# Patient Record
Sex: Male | Born: 1975 | Race: Black or African American | Hispanic: No | Marital: Single | Smoking: Former smoker
Health system: Southern US, Community
[De-identification: ages and names within clinical notes are randomized; demographics above are authoritative.]

## PROBLEM LIST (undated history)

## (undated) DIAGNOSIS — W3400XA Accidental discharge from unspecified firearms or gun, initial encounter: Secondary | ICD-10-CM

## (undated) HISTORY — PX: ABDOMINAL SURGERY: SHX537

---

## 2000-07-04 ENCOUNTER — Emergency Department (HOSPITAL_COMMUNITY): Admission: EM | Admit: 2000-07-04 | Discharge: 2000-07-04 | Payer: Self-pay | Admitting: *Deleted

## 2003-07-20 ENCOUNTER — Emergency Department (HOSPITAL_COMMUNITY): Admission: EM | Admit: 2003-07-20 | Discharge: 2003-07-20 | Payer: Self-pay | Admitting: Emergency Medicine

## 2003-07-21 ENCOUNTER — Emergency Department (HOSPITAL_COMMUNITY): Admission: EM | Admit: 2003-07-21 | Discharge: 2003-07-21 | Payer: Self-pay | Admitting: Emergency Medicine

## 2006-04-09 ENCOUNTER — Emergency Department (HOSPITAL_COMMUNITY): Admission: EM | Admit: 2006-04-09 | Discharge: 2006-04-09 | Payer: Self-pay | Admitting: Emergency Medicine

## 2007-11-30 ENCOUNTER — Inpatient Hospital Stay (HOSPITAL_COMMUNITY): Admission: AC | Admit: 2007-11-30 | Discharge: 2007-12-12 | Payer: Self-pay

## 2007-11-30 ENCOUNTER — Encounter (INDEPENDENT_AMBULATORY_CARE_PROVIDER_SITE_OTHER): Payer: Self-pay | Admitting: General Surgery

## 2007-12-04 HISTORY — PX: COLOSTOMY: SHX63

## 2007-12-06 HISTORY — PX: SMALL INTESTINE SURGERY: SHX150

## 2007-12-10 ENCOUNTER — Encounter (INDEPENDENT_AMBULATORY_CARE_PROVIDER_SITE_OTHER): Payer: Self-pay | Admitting: General Surgery

## 2007-12-10 ENCOUNTER — Ambulatory Visit: Payer: Self-pay | Admitting: Vascular Surgery

## 2007-12-17 ENCOUNTER — Inpatient Hospital Stay (HOSPITAL_COMMUNITY): Admission: EM | Admit: 2007-12-17 | Discharge: 2008-01-10 | Payer: Self-pay | Admitting: Emergency Medicine

## 2008-01-26 ENCOUNTER — Inpatient Hospital Stay (HOSPITAL_COMMUNITY): Admission: EM | Admit: 2008-01-26 | Discharge: 2008-02-04 | Payer: Self-pay | Admitting: Emergency Medicine

## 2008-02-19 ENCOUNTER — Encounter (INDEPENDENT_AMBULATORY_CARE_PROVIDER_SITE_OTHER): Payer: Self-pay | Admitting: Emergency Medicine

## 2008-02-19 ENCOUNTER — Emergency Department (HOSPITAL_COMMUNITY): Admission: EM | Admit: 2008-02-19 | Discharge: 2008-02-19 | Payer: Self-pay | Admitting: Emergency Medicine

## 2008-02-19 ENCOUNTER — Ambulatory Visit: Payer: Self-pay | Admitting: Vascular Surgery

## 2008-03-07 HISTORY — PX: GASTROCUTANEOUS FISTULA CLOSURE: SHX1695

## 2008-03-07 HISTORY — PX: COLOSTOMY REVERSAL: SHX5782

## 2008-05-16 ENCOUNTER — Ambulatory Visit (HOSPITAL_COMMUNITY): Admission: RE | Admit: 2008-05-16 | Discharge: 2008-05-16 | Payer: Self-pay | Admitting: General Surgery

## 2008-06-05 HISTORY — PX: APPENDECTOMY: SHX54

## 2008-06-05 HISTORY — PX: CHOLECYSTECTOMY: SHX55

## 2008-06-16 ENCOUNTER — Inpatient Hospital Stay (HOSPITAL_COMMUNITY): Admission: RE | Admit: 2008-06-16 | Discharge: 2008-06-24 | Payer: Self-pay | Admitting: General Surgery

## 2008-06-16 ENCOUNTER — Encounter (INDEPENDENT_AMBULATORY_CARE_PROVIDER_SITE_OTHER): Payer: Self-pay | Admitting: General Surgery

## 2008-06-17 ENCOUNTER — Ambulatory Visit: Payer: Self-pay | Admitting: Vascular Surgery

## 2008-06-17 ENCOUNTER — Encounter (INDEPENDENT_AMBULATORY_CARE_PROVIDER_SITE_OTHER): Payer: Self-pay | Admitting: General Surgery

## 2008-06-29 ENCOUNTER — Encounter (INDEPENDENT_AMBULATORY_CARE_PROVIDER_SITE_OTHER): Payer: Self-pay | Admitting: General Surgery

## 2008-06-29 ENCOUNTER — Inpatient Hospital Stay (HOSPITAL_COMMUNITY): Admission: EM | Admit: 2008-06-29 | Discharge: 2008-07-14 | Payer: Self-pay | Admitting: Emergency Medicine

## 2008-07-07 ENCOUNTER — Encounter (INDEPENDENT_AMBULATORY_CARE_PROVIDER_SITE_OTHER): Payer: Self-pay | Admitting: General Surgery

## 2008-07-09 ENCOUNTER — Encounter (INDEPENDENT_AMBULATORY_CARE_PROVIDER_SITE_OTHER): Payer: Self-pay | Admitting: Surgery

## 2008-07-09 ENCOUNTER — Ambulatory Visit: Payer: Self-pay | Admitting: Vascular Surgery

## 2008-07-10 ENCOUNTER — Ambulatory Visit: Payer: Self-pay | Admitting: Infectious Diseases

## 2008-10-27 ENCOUNTER — Ambulatory Visit (HOSPITAL_COMMUNITY): Admission: RE | Admit: 2008-10-27 | Discharge: 2008-10-27 | Payer: Self-pay | Admitting: Psychology

## 2008-12-09 ENCOUNTER — Inpatient Hospital Stay (HOSPITAL_COMMUNITY): Admission: RE | Admit: 2008-12-09 | Discharge: 2008-12-15 | Payer: Self-pay | Admitting: General Surgery

## 2008-12-09 ENCOUNTER — Encounter (INDEPENDENT_AMBULATORY_CARE_PROVIDER_SITE_OTHER): Payer: Self-pay | Admitting: General Surgery

## 2009-04-30 ENCOUNTER — Emergency Department (HOSPITAL_COMMUNITY): Admission: EM | Admit: 2009-04-30 | Discharge: 2009-04-30 | Payer: Self-pay | Admitting: Family Medicine
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
## 2010-04-13 IMAGING — CR DG CHEST 1V PORT
1 series · 1 of 1 positions shown · non-contrast
Comparison: 12/18/2007

CLINICAL DATA: Rule out pneumothorax

PORTABLE CHEST - 1 VIEW

[view not recorded]
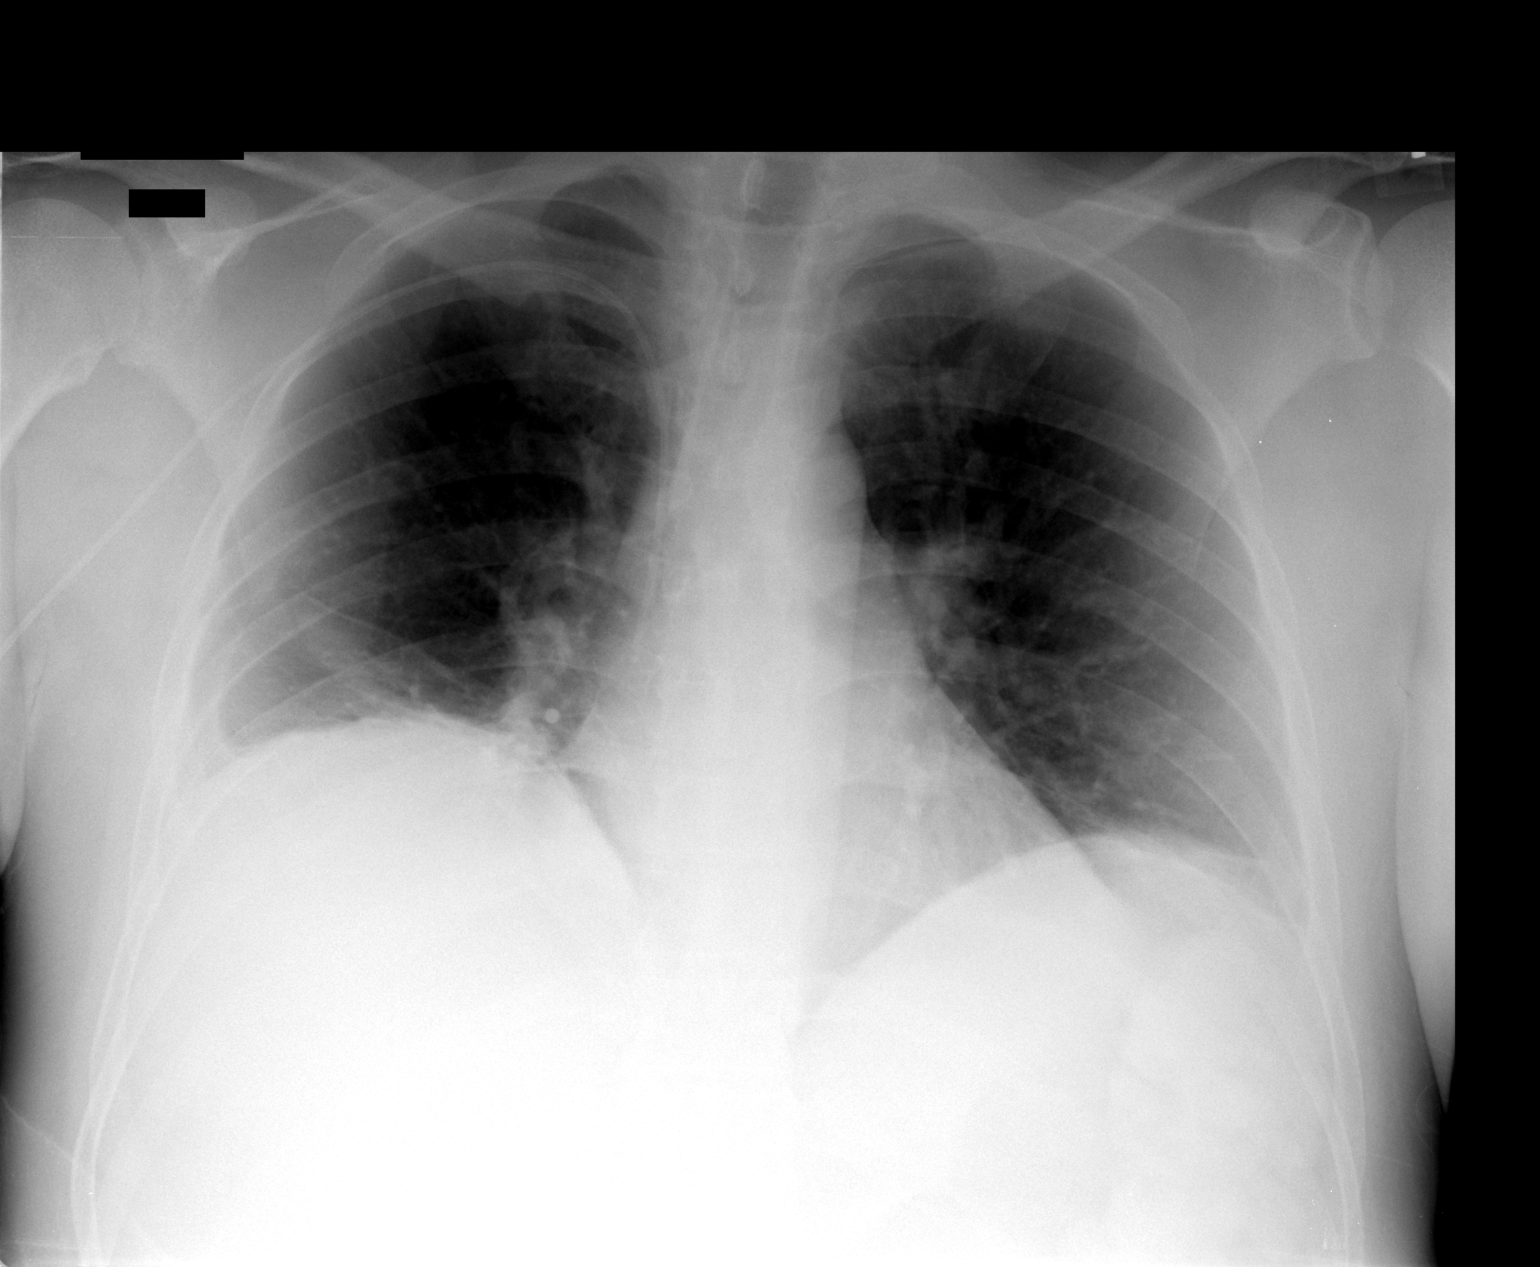

[1 of 1 positions shown; findings below may reference images not displayed]

FINDINGS: Cardiomediastinal silhouette is stable.  Right basilar
atelectasis, scarring or residual infiltrate again noted without
significant change.  No change in the right PICC line position no
diagnostic pneumothorax.
IMPRESSION: No diagnostic pneumothorax.  Stable right basilar atelectasis,
scarring or residual infiltrate.
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
## 2010-06-10 LAB — CROSSMATCH: Antibody Screen: NEGATIVE

## 2010-06-10 LAB — DIFFERENTIAL
Basophils Relative: 0 % (ref 0–1)
Eosinophils Absolute: 0 10*3/uL (ref 0.0–0.7)
Monocytes Absolute: 0.3 10*3/uL (ref 0.1–1.0)
Neutro Abs: 7.5 10*3/uL (ref 1.7–7.7)

## 2010-06-10 LAB — BASIC METABOLIC PANEL
BUN: 2 mg/dL — ABNORMAL LOW (ref 6–23)
CO2: 22 mEq/L (ref 19–32)
Calcium: 8.4 mg/dL (ref 8.4–10.5)
Chloride: 104 mEq/L (ref 96–112)
GFR calc Af Amer: >60 mL/min (ref >60)
GFR calc non Af Amer: >60 mL/min (ref >60)
Glucose, Bld: 170 mg/dL — ABNORMAL HIGH (ref 70–99)
Potassium: 3.6 mEq/L (ref 3.5–5.1)
Sodium: 138 mEq/L (ref 135–145)

## 2010-06-10 LAB — CBC
HCT: 38.1 % — ABNORMAL LOW (ref 39.0–52.0)
Hemoglobin: 10.2 g/dL — ABNORMAL LOW (ref 13.0–17.0)
Hemoglobin: 10.9 g/dL — ABNORMAL LOW (ref 13.0–17.0)
Hemoglobin: 12.8 g/dL — ABNORMAL LOW (ref 13.0–17.0)
MCHC: 33.6 g/dL (ref 30.0–36.0)
MCHC: 33.8 g/dL (ref 30.0–36.0)
MCHC: 34.2 g/dL (ref 30.0–36.0)
MCV: 85.4 fL (ref 78.0–100.0)
Platelets: 148 10*3/uL — ABNORMAL LOW (ref 150–400)
Platelets: 183 10*3/uL (ref 150–400)
RBC: 3.5 MIL/uL — ABNORMAL LOW (ref 4.22–5.81)
RBC: 4.43 MIL/uL (ref 4.22–5.81)
RDW: 15.8 % — ABNORMAL HIGH (ref 11.5–15.5)
RDW: 15.8 % — ABNORMAL HIGH (ref 11.5–15.5)
WBC: 6.6 10*3/uL (ref 4.0–10.5)

## 2010-06-11 LAB — DIFFERENTIAL
Basophils Absolute: 0 10*3/uL (ref 0.0–0.1)
Lymphocytes Relative: 41 % (ref 12–46)
Lymphs Abs: 1.8 10*3/uL (ref 0.7–4.0)
Neutrophils Relative %: 49 % (ref 43–77)

## 2010-06-11 LAB — COMPREHENSIVE METABOLIC PANEL
BUN: 2 mg/dL — ABNORMAL LOW (ref 6–23)
CO2: 32 mEq/L (ref 19–32)
Calcium: 9.8 mg/dL (ref 8.4–10.5)
Chloride: 102 mEq/L (ref 96–112)
Creatinine, Ser: 0.93 mg/dL (ref 0.4–1.5)
GFR calc non Af Amer: >60 mL/min (ref >60)
Glucose, Bld: 95 mg/dL (ref 70–99)
Total Bilirubin: 0.9 mg/dL (ref 0.3–1.2)

## 2010-06-11 LAB — CBC
HCT: 42.1 % (ref 39.0–52.0)
Hemoglobin: 14.2 g/dL (ref 13.0–17.0)
MCHC: 33.7 g/dL (ref 30.0–36.0)
MCV: 84.9 fL (ref 78.0–100.0)
RBC: 4.96 MIL/uL (ref 4.22–5.81)

## 2010-06-11 LAB — PROTIME-INR
INR: 1 (ref 0.00–1.49)
Prothrombin Time: 12.7 seconds (ref 11.6–15.2)

## 2010-06-15 LAB — CATH TIP CULTURE: Culture: >100

## 2010-06-15 LAB — COMPREHENSIVE METABOLIC PANEL
ALT: 100 U/L — ABNORMAL HIGH (ref 0–53)
Albumin: 2.7 g/dL — ABNORMAL LOW (ref 3.5–5.2)
Alkaline Phosphatase: 134 U/L — ABNORMAL HIGH (ref 39–117)
Alkaline Phosphatase: 134 U/L — ABNORMAL HIGH (ref 39–117)
BUN: 11 mg/dL (ref 6–23)
BUN: 7 mg/dL (ref 6–23)
CO2: 26 mEq/L (ref 19–32)
Calcium: 7.9 mg/dL — ABNORMAL LOW (ref 8.4–10.5)
Calcium: 8.3 mg/dL — ABNORMAL LOW (ref 8.4–10.5)
GFR calc non Af Amer: >60 mL/min (ref >60)
Glucose, Bld: 151 mg/dL — ABNORMAL HIGH (ref 70–99)
Potassium: 4.4 mEq/L (ref 3.5–5.1)
Sodium: 138 mEq/L (ref 135–145)
Total Protein: 6.7 g/dL (ref 6.0–8.3)

## 2010-06-15 LAB — GLUCOSE, CAPILLARY
Glucose-Capillary: 105 mg/dL — ABNORMAL HIGH (ref 70–99)
Glucose-Capillary: 106 mg/dL — ABNORMAL HIGH (ref 70–99)
Glucose-Capillary: 107 mg/dL — ABNORMAL HIGH (ref 70–99)
Glucose-Capillary: 108 mg/dL — ABNORMAL HIGH (ref 70–99)
Glucose-Capillary: 112 mg/dL — ABNORMAL HIGH (ref 70–99)
Glucose-Capillary: 117 mg/dL — ABNORMAL HIGH (ref 70–99)
Glucose-Capillary: 119 mg/dL — ABNORMAL HIGH (ref 70–99)
Glucose-Capillary: 123 mg/dL — ABNORMAL HIGH (ref 70–99)
Glucose-Capillary: 147 mg/dL — ABNORMAL HIGH (ref 70–99)
Glucose-Capillary: 156 mg/dL — ABNORMAL HIGH (ref 70–99)
Glucose-Capillary: 81 mg/dL (ref 70–99)
Glucose-Capillary: 87 mg/dL (ref 70–99)
Glucose-Capillary: 93 mg/dL (ref 70–99)
Glucose-Capillary: 97 mg/dL (ref 70–99)

## 2010-06-15 LAB — CBC
HCT: 26.1 % — ABNORMAL LOW (ref 39.0–52.0)
HCT: 26.5 % — ABNORMAL LOW (ref 39.0–52.0)
HCT: 27.3 % — ABNORMAL LOW (ref 39.0–52.0)
HCT: 27.8 % — ABNORMAL LOW (ref 39.0–52.0)
HCT: 30.1 % — ABNORMAL LOW (ref 39.0–52.0)
Hemoglobin: 8.9 g/dL — ABNORMAL LOW (ref 13.0–17.0)
Hemoglobin: 9.1 g/dL — ABNORMAL LOW (ref 13.0–17.0)
Hemoglobin: 9.3 g/dL — ABNORMAL LOW (ref 13.0–17.0)
Hemoglobin: 9.5 g/dL — ABNORMAL LOW (ref 13.0–17.0)
MCHC: 34 g/dL (ref 30.0–36.0)
MCHC: 34.4 g/dL (ref 30.0–36.0)
MCV: 83.8 fL (ref 78.0–100.0)
MCV: 84.2 fL (ref 78.0–100.0)
Platelets: 185 10*3/uL (ref 150–400)
Platelets: 194 10*3/uL (ref 150–400)
Platelets: 263 10*3/uL (ref 150–400)
RBC: 3.09 MIL/uL — ABNORMAL LOW (ref 4.22–5.81)
RBC: 3.19 MIL/uL — ABNORMAL LOW (ref 4.22–5.81)
RDW: 15.2 % (ref 11.5–15.5)
RDW: 15.3 % (ref 11.5–15.5)
RDW: 15.3 % (ref 11.5–15.5)
RDW: 15.4 % (ref 11.5–15.5)
WBC: 4.7 10*3/uL (ref 4.0–10.5)

## 2010-06-15 LAB — URINALYSIS, ROUTINE W REFLEX MICROSCOPIC
Bilirubin Urine: NEGATIVE
Hgb urine dipstick: NEGATIVE
Protein, ur: NEGATIVE mg/dL
Urobilinogen, UA: 0.2 mg/dL (ref 0.0–1.0)

## 2010-06-15 LAB — PHOSPHORUS
Phosphorus: 3 mg/dL (ref 2.3–4.6)
Phosphorus: 3.1 mg/dL (ref 2.3–4.6)

## 2010-06-15 LAB — BASIC METABOLIC PANEL
BUN: 3 mg/dL — ABNORMAL LOW (ref 6–23)
CO2: 27 mEq/L (ref 19–32)
Calcium: 8.2 mg/dL — ABNORMAL LOW (ref 8.4–10.5)
Calcium: 8.2 mg/dL — ABNORMAL LOW (ref 8.4–10.5)
Creatinine, Ser: 0.73 mg/dL (ref 0.4–1.5)
Creatinine, Ser: 0.74 mg/dL (ref 0.4–1.5)
GFR calc Af Amer: >60 mL/min (ref >60)
GFR calc Af Amer: >60 mL/min (ref >60)
GFR calc non Af Amer: >60 mL/min (ref >60)
GFR calc non Af Amer: >60 mL/min (ref >60)
GFR calc non Af Amer: >60 mL/min (ref >60)
Glucose, Bld: 103 mg/dL — ABNORMAL HIGH (ref 70–99)
Glucose, Bld: 151 mg/dL — ABNORMAL HIGH (ref 70–99)
Potassium: 3.6 mEq/L (ref 3.5–5.1)
Sodium: 131 mEq/L — ABNORMAL LOW (ref 135–145)
Sodium: 133 mEq/L — ABNORMAL LOW (ref 135–145)

## 2010-06-15 LAB — DIFFERENTIAL
Basophils Absolute: 0 10*3/uL (ref 0.0–0.1)
Basophils Relative: 0 % (ref 0–1)
Basophils Relative: 0 % (ref 0–1)
Basophils Relative: 1 % (ref 0–1)
Eosinophils Absolute: 0.1 10*3/uL (ref 0.0–0.7)
Eosinophils Absolute: 0.2 10*3/uL (ref 0.0–0.7)
Eosinophils Relative: 2 % (ref 0–5)
Lymphocytes Relative: 17 % (ref 12–46)
Lymphs Abs: 1 10*3/uL (ref 0.7–4.0)
Monocytes Absolute: 0.2 10*3/uL (ref 0.1–1.0)
Monocytes Absolute: 0.6 10*3/uL (ref 0.1–1.0)
Monocytes Relative: 4 % (ref 3–12)
Neutro Abs: 3.5 10*3/uL (ref 1.7–7.7)
Neutro Abs: 4.5 10*3/uL (ref 1.7–7.7)
Neutrophils Relative %: 61 % (ref 43–77)

## 2010-06-15 LAB — URINE CULTURE
Colony Count: NO GROWTH
Culture: NO GROWTH
Special Requests: NEGATIVE

## 2010-06-15 LAB — CLOSTRIDIUM DIFFICILE EIA: C difficile Toxins A+B, EIA: NEGATIVE

## 2010-06-15 LAB — CULTURE, BLOOD (ROUTINE X 2)

## 2010-06-15 LAB — PREALBUMIN: Prealbumin: 13.2 mg/dL — ABNORMAL LOW (ref 18.0–45.0)

## 2010-06-15 LAB — TRIGLYCERIDES: Triglycerides: 73 mg/dL (ref <150)

## 2010-06-15 LAB — MAGNESIUM: Magnesium: 2.3 mg/dL (ref 1.5–2.5)

## 2010-06-15 LAB — HIV ANTIBODY (ROUTINE TESTING W REFLEX): HIV: NONREACTIVE

## 2010-06-15 LAB — CHOLESTEROL, TOTAL: Cholesterol: 97 mg/dL (ref 0–200)

## 2010-06-16 LAB — CBC
HCT: 27.1 % — ABNORMAL LOW (ref 39.0–52.0)
HCT: 27.9 % — ABNORMAL LOW (ref 39.0–52.0)
HCT: 30.6 % — ABNORMAL LOW (ref 39.0–52.0)
HCT: 32.1 % — ABNORMAL LOW (ref 39.0–52.0)
HCT: 33.2 % — ABNORMAL LOW (ref 39.0–52.0)
HCT: 31.5 % — ABNORMAL LOW (ref 39.0–52.0)
HCT: 33.8 % — ABNORMAL LOW (ref 39.0–52.0)
HCT: 34.1 % — ABNORMAL LOW (ref 39.0–52.0)
Hemoglobin: 10.5 g/dL — ABNORMAL LOW (ref 13.0–17.0)
Hemoglobin: 10.9 g/dL — ABNORMAL LOW (ref 13.0–17.0)
Hemoglobin: 11.2 g/dL — ABNORMAL LOW (ref 13.0–17.0)
Hemoglobin: 8.9 g/dL — ABNORMAL LOW (ref 13.0–17.0)
Hemoglobin: 9.2 g/dL — ABNORMAL LOW (ref 13.0–17.0)
Hemoglobin: 9.4 g/dL — ABNORMAL LOW (ref 13.0–17.0)
Hemoglobin: 9.6 g/dL — ABNORMAL LOW (ref 13.0–17.0)
Hemoglobin: 10.7 g/dL — ABNORMAL LOW (ref 13.0–17.0)
Hemoglobin: 11.3 g/dL — ABNORMAL LOW (ref 13.0–17.0)
Hemoglobin: 11.5 g/dL — ABNORMAL LOW (ref 13.0–17.0)
MCHC: 33.6 g/dL (ref 30.0–36.0)
MCHC: 33.9 g/dL (ref 30.0–36.0)
MCHC: 34.3 g/dL (ref 30.0–36.0)
MCHC: 33.3 g/dL (ref 30.0–36.0)
MCHC: 33.7 g/dL (ref 30.0–36.0)
MCHC: 33.9 g/dL (ref 30.0–36.0)
MCHC: 34 g/dL (ref 30.0–36.0)
MCV: 85.9 fL (ref 78.0–100.0)
MCV: 85.9 fL (ref 78.0–100.0)
MCV: 86.6 fL (ref 78.0–100.0)
MCV: 84.9 fL (ref 78.0–100.0)
MCV: 85.3 fL (ref 78.0–100.0)
MCV: 85.3 fL (ref 78.0–100.0)
Platelets: 288 10*3/uL (ref 150–400)
Platelets: 323 K/uL (ref 150–400)
Platelets: 327 10*3/uL (ref 150–400)
Platelets: 332 10*3/uL (ref 150–400)
Platelets: 442 10*3/uL — ABNORMAL HIGH (ref 150–400)
Platelets: 127 10*3/uL — ABNORMAL LOW (ref 150–400)
Platelets: 130 10*3/uL — ABNORMAL LOW (ref 150–400)
Platelets: 140 K/uL — ABNORMAL LOW (ref 150–400)
Platelets: 160 K/uL (ref 150–400)
RBC: 3.15 MIL/uL — ABNORMAL LOW (ref 4.22–5.81)
RBC: 3.22 MIL/uL — ABNORMAL LOW (ref 4.22–5.81)
RBC: 3.27 MIL/uL — ABNORMAL LOW (ref 4.22–5.81)
RBC: 3.7 MIL/uL — ABNORMAL LOW (ref 4.22–5.81)
RBC: 3.96 MIL/uL — ABNORMAL LOW (ref 4.22–5.81)
RBC: 4.02 MIL/uL — ABNORMAL LOW (ref 4.22–5.81)
RDW: 15.3 % (ref 11.5–15.5)
RDW: 15.6 % — ABNORMAL HIGH (ref 11.5–15.5)
RDW: 15.7 % — ABNORMAL HIGH (ref 11.5–15.5)
RDW: 16.6 % — ABNORMAL HIGH (ref 11.5–15.5)
RDW: 16.9 % — ABNORMAL HIGH (ref 11.5–15.5)
RDW: 17 % — ABNORMAL HIGH (ref 11.5–15.5)
RDW: 17.1 % — ABNORMAL HIGH (ref 11.5–15.5)
RDW: 17.6 % — ABNORMAL HIGH (ref 11.5–15.5)
WBC: 11.1 10*3/uL — ABNORMAL HIGH (ref 4.0–10.5)
WBC: 15.2 10*3/uL — ABNORMAL HIGH (ref 4.0–10.5)
WBC: 6.9 10*3/uL (ref 4.0–10.5)
WBC: 6.9 K/uL (ref 4.0–10.5)
WBC: 10.8 10*3/uL — ABNORMAL HIGH (ref 4.0–10.5)
WBC: 11.2 K/uL — ABNORMAL HIGH (ref 4.0–10.5)
WBC: 9.2 K/uL (ref 4.0–10.5)

## 2010-06-16 LAB — MAGNESIUM
Magnesium: 2.2 mg/dL (ref 1.5–2.5)
Magnesium: 2.4 mg/dL (ref 1.5–2.5)
Magnesium: 1.5 mg/dL (ref 1.5–2.5)
Magnesium: 2.2 mg/dL (ref 1.5–2.5)
Magnesium: 2.3 mg/dL (ref 1.5–2.5)

## 2010-06-16 LAB — GLUCOSE, CAPILLARY
Glucose-Capillary: 101 mg/dL — ABNORMAL HIGH (ref 70–99)
Glucose-Capillary: 103 mg/dL — ABNORMAL HIGH (ref 70–99)
Glucose-Capillary: 104 mg/dL — ABNORMAL HIGH (ref 70–99)
Glucose-Capillary: 108 mg/dL — ABNORMAL HIGH (ref 70–99)
Glucose-Capillary: 109 mg/dL — ABNORMAL HIGH (ref 70–99)
Glucose-Capillary: 114 mg/dL — ABNORMAL HIGH (ref 70–99)
Glucose-Capillary: 117 mg/dL — ABNORMAL HIGH (ref 70–99)
Glucose-Capillary: 120 mg/dL — ABNORMAL HIGH (ref 70–99)
Glucose-Capillary: 121 mg/dL — ABNORMAL HIGH (ref 70–99)
Glucose-Capillary: 127 mg/dL — ABNORMAL HIGH (ref 70–99)
Glucose-Capillary: 129 mg/dL — ABNORMAL HIGH (ref 70–99)
Glucose-Capillary: 134 mg/dL — ABNORMAL HIGH (ref 70–99)
Glucose-Capillary: 135 mg/dL — ABNORMAL HIGH (ref 70–99)
Glucose-Capillary: 140 mg/dL — ABNORMAL HIGH (ref 70–99)
Glucose-Capillary: 143 mg/dL — ABNORMAL HIGH (ref 70–99)
Glucose-Capillary: 144 mg/dL — ABNORMAL HIGH (ref 70–99)
Glucose-Capillary: 145 mg/dL — ABNORMAL HIGH (ref 70–99)
Glucose-Capillary: 146 mg/dL — ABNORMAL HIGH (ref 70–99)
Glucose-Capillary: 147 mg/dL — ABNORMAL HIGH (ref 70–99)
Glucose-Capillary: 153 mg/dL — ABNORMAL HIGH (ref 70–99)
Glucose-Capillary: 165 mg/dL — ABNORMAL HIGH (ref 70–99)
Glucose-Capillary: 219 mg/dL — ABNORMAL HIGH (ref 70–99)
Glucose-Capillary: 82 mg/dL (ref 70–99)
Glucose-Capillary: 83 mg/dL (ref 70–99)
Glucose-Capillary: 85 mg/dL (ref 70–99)
Glucose-Capillary: 95 mg/dL (ref 70–99)
Glucose-Capillary: 97 mg/dL (ref 70–99)
Glucose-Capillary: 111 mg/dL — ABNORMAL HIGH (ref 70–99)
Glucose-Capillary: 116 mg/dL — ABNORMAL HIGH (ref 70–99)
Glucose-Capillary: 119 mg/dL — ABNORMAL HIGH (ref 70–99)
Glucose-Capillary: 143 mg/dL — ABNORMAL HIGH (ref 70–99)
Glucose-Capillary: 170 mg/dL — ABNORMAL HIGH (ref 70–99)
Glucose-Capillary: 173 mg/dL — ABNORMAL HIGH (ref 70–99)
Glucose-Capillary: 182 mg/dL — ABNORMAL HIGH (ref 70–99)
Glucose-Capillary: 216 mg/dL — ABNORMAL HIGH (ref 70–99)
Glucose-Capillary: 255 mg/dL — ABNORMAL HIGH (ref 70–99)
Glucose-Capillary: 89 mg/dL (ref 70–99)

## 2010-06-16 LAB — COMPREHENSIVE METABOLIC PANEL
ALT: 118 U/L — ABNORMAL HIGH (ref 0–53)
ALT: 110 U/L — ABNORMAL HIGH (ref 0–53)
ALT: 130 U/L — ABNORMAL HIGH (ref 0–53)
ALT: 222 U/L — ABNORMAL HIGH (ref 0–53)
AST: 24 U/L (ref 0–37)
AST: 49 U/L — ABNORMAL HIGH (ref 0–37)
AST: 91 U/L — ABNORMAL HIGH (ref 0–37)
Albumin: 2 g/dL — ABNORMAL LOW (ref 3.5–5.2)
Albumin: 2.4 g/dL — ABNORMAL LOW (ref 3.5–5.2)
Albumin: 2.4 g/dL — ABNORMAL LOW (ref 3.5–5.2)
Alkaline Phosphatase: 133 U/L — ABNORMAL HIGH (ref 39–117)
Alkaline Phosphatase: 168 U/L — ABNORMAL HIGH (ref 39–117)
Alkaline Phosphatase: 106 U/L (ref 39–117)
Alkaline Phosphatase: 116 U/L (ref 39–117)
BUN: 11 mg/dL (ref 6–23)
BUN: 11 mg/dL (ref 6–23)
CO2: 26 mEq/L (ref 19–32)
CO2: 27 mEq/L (ref 19–32)
CO2: 29 mEq/L (ref 19–32)
CO2: 29 mEq/L (ref 19–32)
Calcium: 7.6 mg/dL — ABNORMAL LOW (ref 8.4–10.5)
Calcium: 8 mg/dL — ABNORMAL LOW (ref 8.4–10.5)
Calcium: 8.2 mg/dL — ABNORMAL LOW (ref 8.4–10.5)
Calcium: 8.3 mg/dL — ABNORMAL LOW (ref 8.4–10.5)
Chloride: 102 mEq/L (ref 96–112)
Chloride: 102 mEq/L (ref 96–112)
Chloride: 98 mEq/L (ref 96–112)
Creatinine, Ser: 0.58 mg/dL (ref 0.4–1.5)
Creatinine, Ser: 0.66 mg/dL (ref 0.4–1.5)
Creatinine, Ser: 0.67 mg/dL (ref 0.4–1.5)
GFR calc Af Amer: >60 mL/min (ref >60)
GFR calc Af Amer: >60 mL/min (ref >60)
GFR calc Af Amer: >60 mL/min (ref >60)
GFR calc non Af Amer: >60 mL/min (ref >60)
GFR calc non Af Amer: >60 mL/min (ref >60)
GFR calc non Af Amer: >60 mL/min (ref >60)
GFR calc non Af Amer: >60 mL/min (ref >60)
Glucose, Bld: 131 mg/dL — ABNORMAL HIGH (ref 70–99)
Glucose, Bld: 136 mg/dL — ABNORMAL HIGH (ref 70–99)
Glucose, Bld: 208 mg/dL — ABNORMAL HIGH (ref 70–99)
Glucose, Bld: 179 mg/dL — ABNORMAL HIGH (ref 70–99)
Glucose, Bld: 224 mg/dL — ABNORMAL HIGH (ref 70–99)
Potassium: 4.4 mEq/L (ref 3.5–5.1)
Potassium: 4 mEq/L (ref 3.5–5.1)
Potassium: 4.3 mEq/L (ref 3.5–5.1)
Sodium: 136 mEq/L (ref 135–145)
Sodium: 134 mEq/L — ABNORMAL LOW (ref 135–145)
Sodium: 135 mEq/L (ref 135–145)
Sodium: 135 mEq/L (ref 135–145)
Total Bilirubin: 0.6 mg/dL (ref 0.3–1.2)
Total Bilirubin: 0.6 mg/dL (ref 0.3–1.2)
Total Bilirubin: 2.4 mg/dL — ABNORMAL HIGH (ref 0.3–1.2)
Total Bilirubin: 3 mg/dL — ABNORMAL HIGH (ref 0.3–1.2)
Total Protein: 4.9 g/dL — ABNORMAL LOW (ref 6.0–8.3)
Total Protein: 5.2 g/dL — ABNORMAL LOW (ref 6.0–8.3)
Total Protein: 5.6 g/dL — ABNORMAL LOW (ref 6.0–8.3)

## 2010-06-16 LAB — BASIC METABOLIC PANEL
BUN: 12 mg/dL (ref 6–23)
BUN: 5 mg/dL — ABNORMAL LOW (ref 6–23)
CO2: 26 mEq/L (ref 19–32)
CO2: 29 mEq/L (ref 19–32)
Calcium: 8.6 mg/dL (ref 8.4–10.5)
Chloride: 105 mEq/L (ref 96–112)
GFR calc Af Amer: >60 mL/min (ref >60)
GFR calc Af Amer: >60 mL/min (ref >60)
GFR calc non Af Amer: >60 mL/min (ref >60)
GFR calc non Af Amer: >60 mL/min (ref >60)
GFR calc non Af Amer: >60 mL/min (ref >60)
GFR calc non Af Amer: >60 mL/min (ref >60)
Glucose, Bld: 138 mg/dL — ABNORMAL HIGH (ref 70–99)
Glucose, Bld: 160 mg/dL — ABNORMAL HIGH (ref 70–99)
Glucose, Bld: 178 mg/dL — ABNORMAL HIGH (ref 70–99)
Glucose, Bld: 209 mg/dL — ABNORMAL HIGH (ref 70–99)
Potassium: 4 mEq/L (ref 3.5–5.1)
Potassium: 4 mEq/L (ref 3.5–5.1)
Potassium: 4.3 mEq/L (ref 3.5–5.1)
Sodium: 135 mEq/L (ref 135–145)
Sodium: 136 mEq/L (ref 135–145)
Sodium: 138 mEq/L (ref 135–145)

## 2010-06-16 LAB — DIFFERENTIAL
Basophils Absolute: 0 K/uL (ref 0.0–0.1)
Basophils Relative: 0 % (ref 0–1)
Basophils Relative: 0 % (ref 0–1)
Basophils Relative: 1 % (ref 0–1)
Eosinophils Absolute: 0.3 K/uL (ref 0.0–0.7)
Eosinophils Absolute: 0.4 10*3/uL (ref 0.0–0.7)
Eosinophils Absolute: 0 10*3/uL (ref 0.0–0.7)
Eosinophils Relative: 0 % (ref 0–5)
Eosinophils Relative: 5 % (ref 0–5)
Eosinophils Relative: 0 % (ref 0–5)
Lymphocytes Relative: 21 % (ref 12–46)
Lymphocytes Relative: 4 % — ABNORMAL LOW (ref 12–46)
Lymphocytes Relative: 8 % — ABNORMAL LOW (ref 12–46)
Lymphs Abs: 0.6 10*3/uL — ABNORMAL LOW (ref 0.7–4.0)
Lymphs Abs: 1.5 K/uL (ref 0.7–4.0)
Lymphs Abs: 1 10*3/uL (ref 0.7–4.0)
Monocytes Absolute: 0.4 K/uL (ref 0.1–1.0)
Monocytes Absolute: 0.9 10*3/uL (ref 0.1–1.0)
Monocytes Absolute: 0.7 10*3/uL (ref 0.1–1.0)
Monocytes Relative: 5 % (ref 3–12)
Monocytes Relative: 7 % (ref 3–12)
Neutro Abs: 10.7 10*3/uL — ABNORMAL HIGH (ref 1.7–7.7)
Neutro Abs: 4.8 K/uL (ref 1.7–7.7)
Neutrophils Relative %: 69 % (ref 43–77)
Neutrophils Relative %: 74 % (ref 43–77)
Neutrophils Relative %: 85 % — ABNORMAL HIGH (ref 43–77)

## 2010-06-16 LAB — CROSSMATCH: Antibody Screen: NEGATIVE

## 2010-06-16 LAB — POCT I-STAT, CHEM 8
BUN: 7 mg/dL (ref 6–23)
Chloride: 100 mEq/L (ref 96–112)
Creatinine, Ser: 0.6 mg/dL (ref 0.4–1.5)
Glucose, Bld: 186 mg/dL — ABNORMAL HIGH (ref 70–99)
Potassium: 3.6 mEq/L (ref 3.5–5.1)

## 2010-06-16 LAB — PHOSPHORUS
Phosphorus: 2.3 mg/dL (ref 2.3–4.6)
Phosphorus: 2.6 mg/dL (ref 2.3–4.6)
Phosphorus: 1.7 mg/dL — ABNORMAL LOW (ref 2.3–4.6)
Phosphorus: 5 mg/dL — ABNORMAL HIGH (ref 2.3–4.6)
Phosphorus: <1 mg/dL — CL (ref 2.3–4.6)

## 2010-06-16 LAB — HEPATIC FUNCTION PANEL
ALT: 217 U/L — ABNORMAL HIGH (ref 0–53)
Alkaline Phosphatase: 148 U/L — ABNORMAL HIGH (ref 39–117)
Indirect Bilirubin: 0.6 mg/dL (ref 0.3–0.9)
Total Protein: 7.8 g/dL (ref 6.0–8.3)

## 2010-06-16 LAB — LIPID PANEL
HDL: 20 mg/dL — ABNORMAL LOW
Total CHOL/HDL Ratio: 4.7 ratio
Triglycerides: 52 mg/dL
VLDL: 10 mg/dL (ref 0–40)

## 2010-06-16 LAB — POCT I-STAT EG7
Bicarbonate: 24.6 mEq/L — ABNORMAL HIGH (ref 20.0–24.0)
Calcium, Ion: 1.14 mmol/L (ref 1.12–1.32)
HCT: 24 % — ABNORMAL LOW (ref 39.0–52.0)
Hemoglobin: 8.2 g/dL — ABNORMAL LOW (ref 13.0–17.0)
Sodium: 133 mEq/L — ABNORMAL LOW (ref 135–145)
pH, Ven: 7.486 — ABNORMAL HIGH (ref 7.250–7.300)
pO2, Ven: 384 mmHg — ABNORMAL HIGH (ref 30.0–45.0)

## 2010-06-16 LAB — CULTURE, ROUTINE-ABSCESS

## 2010-06-16 LAB — PREALBUMIN
Prealbumin: 12.2 mg/dL — ABNORMAL LOW (ref 18.0–45.0)
Prealbumin: 8.2 mg/dL — ABNORMAL LOW (ref 18.0–45.0)
Prealbumin: 15.9 mg/dL — ABNORMAL LOW (ref 18.0–45.0)
Prealbumin: 8.4 mg/dL — ABNORMAL LOW (ref 18.0–45.0)

## 2010-06-16 LAB — CHOLESTEROL, TOTAL
Cholesterol: 67 mg/dL (ref 0–200)
Cholesterol: 122 mg/dL (ref 0–200)

## 2010-06-16 LAB — ANAEROBIC CULTURE

## 2010-06-16 LAB — ABO/RH: ABO/RH(D): A POS

## 2010-06-16 LAB — TYPE AND SCREEN: Antibody Screen: NEGATIVE

## 2010-06-16 LAB — TRIGLYCERIDES
Triglycerides: 107 mg/dL (ref <150)
Triglycerides: 42 mg/dL (ref <150)
Triglycerides: 80 mg/dL (ref <150)
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
## 2010-07-20 NOTE — Op Note (Signed)
[name], [name]                   ACCOUNT NO.:  [id number]   MEDICAL RECORD NO.:  [id number]          PATIENT TYPE:  INP   LOCATION:  5159                         FACILITY:  [facility]   PHYSICIAN:  Cherylynn Ridges, M.D.    DATE OF BIRTH:  1975-05-20   DATE OF PROCEDURE:  12/21/2007  DATE OF DISCHARGE:                               OPERATIVE REPORT   PREOPERATIVE DIAGNOSIS:  Open abdominal wound with enterocutaneous  fistula.   POSTOPERATIVE DIAGNOSIS:  Open abdominal wound with enterocutaneous  fistula.   PROCEDURE:  1. Incision and drainage of abdominal wound.  2. Placement of enterostomy tube.  3. Vacuum-assisted closure device dressing.   SURGEON:  Cherylynn Ridges, MD   ASSISTANT:  Lazaro Arms, PA   ANESTHESIA:  General with a laryngeal airway.   ESTIMATED BLOOD LOSS:  Less than 50 mL.   COMPLICATIONS:  No complication.   CONDITION:  Stable.   FINDINGS:  The patient had a small bowel fistula in the lower portion of  the abdominal wound with bilious drainage.   INDICATIONS FOR OPERATION:  The patient is a 35 year old with drainage  from a lower abdominal wound, who had a necrotic open wound, now comes  in for re-exploration and drainage.   OPERATION:  The patient was taken to the operating room and placed on  the table in supine position.  After an adequate general laryngeal  airway anesthetic was administered, his previous dressing, which was  removed and which had bile staining around his left lower quadrant nylon  catheter and also his right lower quadrant wound and from the midline  lower portion of the wound.   We sprayed and then painted with Betadine.  The initial portion of  procedure was used to identify the fistula.  There was bilious drainage  from the mid to lower portion of the abdomen, which upon further  dissection demonstrated a loop of small bowel with a small  intracutaneous fistula.  We could clearly demonstrate this to be the  actual area of  drainage and was supposed to try and to repair this with  little success with all the inflammatory exudate around it.  We chose to  place a 16-French red Robinson catheter with additional holes placed in  the sidewalls deep into the bowel through the fistulous site secured in  place with interrupted pursestring suture of 3-0 Vicryl pop-offs and  then bringing out the right to lower quadrant portion of the abdomen  away from the midline of skin edge.  We irrigated with saline solution  copiously into the pelvic where there was also necrotic debris.  We  removed the nylon catheter, which was in place and replaced with a 19-mm  Blake drain.  This was secured with a 2-0 nylon.  The antrostomy tube  was also secured with a 2-0 nylon.  The right lower quadrant wound was  irrigated with saline solution copiously and palpated deep into the  pelvic area.  We closed this with a long piece of white foam from the  [facility] dressing and then a  subcutaneous piece white foam and then a more  superficial piece of black foam with a VAC dressing.  The midline wound  was covered with a lower portion of the abdomen with a mid patellar  directly on the bowel and then with a white foam followed by a black  foam.  We covered the dressing with the appropriate drape of closure and  applied suction, which did allow the wound to flow and come down and to  be adequately controlled this.  We irrigated the antrostomy tube where  we did get back enteric contents.  We could not flush out any enteric  contents by flushing the antrostomy tube.  There did not appear to be a  second fistula.  Once the dressing was completely checked down, we put a  binder on the patient and taking to his room to recovery room in stable  condition.      Cherylynn Ridges, M.D.  Electronically Signed     JOW/MEDQ  D:  12/21/2007  T:  12/22/2007  Job:  [id number]
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
## 2010-12-06 LAB — CBC
HCT: 43.1
Hemoglobin: 14.2
Hemoglobin: 14.3
Hemoglobin: 14.4
MCHC: 33.2
MCHC: 33.6
MCHC: 33.8
MCV: 87.6
MCV: 88.8
MCV: 89.7
Platelets: 162
Platelets: 224
RBC: 4.64
RBC: 4.77
RBC: 4.85
RBC: 4.9
RDW: 13.1
WBC: 7.7
WBC: 9.4
WBC: 9.6

## 2010-12-06 LAB — BASIC METABOLIC PANEL
BUN: 3 — ABNORMAL LOW
CO2: 28
CO2: 29
Calcium: 8.6
Chloride: 102
Chloride: 104
Creatinine, Ser: 0.95
Creatinine, Ser: 1.16
GFR calc Af Amer: >60
GFR calc Af Amer: >60
Glucose, Bld: 117 — ABNORMAL HIGH
Glucose, Bld: 135 — ABNORMAL HIGH
Sodium: 137
Sodium: 137

## 2010-12-06 LAB — POCT I-STAT, CHEM 8
BUN: 8
Chloride: 103
Creatinine, Ser: 1.4
Potassium: 2.5 — CL
Sodium: 139
TCO2: 19

## 2010-12-06 LAB — TYPE AND SCREEN

## 2010-12-06 LAB — ABO/RH: ABO/RH(D): A POS

## 2010-12-07 LAB — GLUCOSE, CAPILLARY
Glucose-Capillary: 103 — ABNORMAL HIGH
Glucose-Capillary: 107 — ABNORMAL HIGH
Glucose-Capillary: 108 — ABNORMAL HIGH
Glucose-Capillary: 109 — ABNORMAL HIGH
Glucose-Capillary: 109 — ABNORMAL HIGH
Glucose-Capillary: 115 — ABNORMAL HIGH
Glucose-Capillary: 116 — ABNORMAL HIGH
Glucose-Capillary: 116 — ABNORMAL HIGH
Glucose-Capillary: 117 — ABNORMAL HIGH
Glucose-Capillary: 118 — ABNORMAL HIGH
Glucose-Capillary: 119 — ABNORMAL HIGH
Glucose-Capillary: 119 — ABNORMAL HIGH
Glucose-Capillary: 119 — ABNORMAL HIGH
Glucose-Capillary: 121 — ABNORMAL HIGH
Glucose-Capillary: 121 — ABNORMAL HIGH
Glucose-Capillary: 121 — ABNORMAL HIGH
Glucose-Capillary: 124 — ABNORMAL HIGH
Glucose-Capillary: 125 — ABNORMAL HIGH
Glucose-Capillary: 131 — ABNORMAL HIGH
Glucose-Capillary: 131 — ABNORMAL HIGH
Glucose-Capillary: 132 — ABNORMAL HIGH
Glucose-Capillary: 133 — ABNORMAL HIGH
Glucose-Capillary: 137 — ABNORMAL HIGH
Glucose-Capillary: 139 — ABNORMAL HIGH
Glucose-Capillary: 143 — ABNORMAL HIGH
Glucose-Capillary: 144 — ABNORMAL HIGH
Glucose-Capillary: 144 — ABNORMAL HIGH
Glucose-Capillary: 145 — ABNORMAL HIGH
Glucose-Capillary: 155 — ABNORMAL HIGH
Glucose-Capillary: 175 — ABNORMAL HIGH
Glucose-Capillary: 178 — ABNORMAL HIGH
Glucose-Capillary: 181 — ABNORMAL HIGH
Glucose-Capillary: 181 — ABNORMAL HIGH
Glucose-Capillary: 186 — ABNORMAL HIGH
Glucose-Capillary: 188 — ABNORMAL HIGH
Glucose-Capillary: 192 — ABNORMAL HIGH
Glucose-Capillary: 218 — ABNORMAL HIGH
Glucose-Capillary: 103 mg/dL — ABNORMAL HIGH (ref 70–99)
Glucose-Capillary: 103 mg/dL — ABNORMAL HIGH (ref 70–99)
Glucose-Capillary: 111 mg/dL — ABNORMAL HIGH (ref 70–99)
Glucose-Capillary: 119 mg/dL — ABNORMAL HIGH (ref 70–99)
Glucose-Capillary: 119 mg/dL — ABNORMAL HIGH (ref 70–99)
Glucose-Capillary: 121 mg/dL — ABNORMAL HIGH (ref 70–99)
Glucose-Capillary: 123 mg/dL — ABNORMAL HIGH (ref 70–99)
Glucose-Capillary: 124 mg/dL — ABNORMAL HIGH (ref 70–99)
Glucose-Capillary: 128 mg/dL — ABNORMAL HIGH (ref 70–99)
Glucose-Capillary: 128 mg/dL — ABNORMAL HIGH (ref 70–99)
Glucose-Capillary: 131 mg/dL — ABNORMAL HIGH (ref 70–99)
Glucose-Capillary: 135 mg/dL — ABNORMAL HIGH (ref 70–99)
Glucose-Capillary: 139 mg/dL — ABNORMAL HIGH (ref 70–99)
Glucose-Capillary: 141 mg/dL — ABNORMAL HIGH (ref 70–99)
Glucose-Capillary: 148 mg/dL — ABNORMAL HIGH (ref 70–99)

## 2010-12-07 LAB — DIFFERENTIAL
Basophils Relative: 0
Basophils Relative: 0
Basophils Relative: 1
Basophils Relative: 1 % (ref 0–1)
Basophils Relative: 1 % (ref 0–1)
Basophils Relative: 1 % (ref 0–1)
Eosinophils Absolute: 0.2
Eosinophils Absolute: 0.4
Eosinophils Absolute: 0.4 10*3/uL (ref 0.0–0.7)
Eosinophils Relative: 5
Eosinophils Relative: 7 — ABNORMAL HIGH
Eosinophils Relative: 4 % (ref 0–5)
Eosinophils Relative: 6 % — ABNORMAL HIGH (ref 0–5)
Lymphocytes Relative: 13
Lymphocytes Relative: 13
Lymphs Abs: 1.1
Lymphs Abs: 1.2
Lymphs Abs: 2.7 10*3/uL (ref 0.7–4.0)
Monocytes Absolute: 0.3
Monocytes Absolute: 0.5
Monocytes Absolute: 0.2 10*3/uL (ref 0.1–1.0)
Monocytes Absolute: 0.4 10*3/uL (ref 0.1–1.0)
Monocytes Relative: 5
Monocytes Relative: 5
Monocytes Relative: 9
Monocytes Relative: 4 % (ref 3–12)
Monocytes Relative: 5 % (ref 3–12)
Monocytes Relative: 7 % (ref 3–12)
Neutro Abs: 7.5
Neutro Abs: 1.5 10*3/uL — ABNORMAL LOW (ref 1.7–7.7)
Neutro Abs: 3 10*3/uL (ref 1.7–7.7)
Neutrophils Relative %: 78 — ABNORMAL HIGH
Neutrophils Relative %: 84 — ABNORMAL HIGH
Neutrophils Relative %: 61 % (ref 43–77)

## 2010-12-07 LAB — BASIC METABOLIC PANEL
BUN: 5 — ABNORMAL LOW
BUN: 6
BUN: 12
BUN: 13
BUN: 11 mg/dL (ref 6–23)
CO2: 30
CO2: 31
CO2: 31
CO2: 25
CO2: 26
CO2: 26
CO2: 29
CO2: 27 mEq/L (ref 19–32)
CO2: 28 mEq/L (ref 19–32)
CO2: 28 mEq/L (ref 19–32)
Calcium: 7.9 — ABNORMAL LOW
Calcium: 8.1 — ABNORMAL LOW
Calcium: 8.2 — ABNORMAL LOW
Calcium: 8.2 — ABNORMAL LOW
Calcium: 8.4
Calcium: 8.7 mg/dL (ref 8.4–10.5)
Calcium: 9.1 mg/dL (ref 8.4–10.5)
Calcium: 9.3 mg/dL (ref 8.4–10.5)
Calcium: 9.4 mg/dL (ref 8.4–10.5)
Chloride: 92 — ABNORMAL LOW
Chloride: 102
Chloride: 102
Chloride: 101 mEq/L (ref 96–112)
Creatinine, Ser: 1.15
Creatinine, Ser: 0.69 mg/dL (ref 0.4–1.5)
Creatinine, Ser: 0.77 mg/dL (ref 0.4–1.5)
GFR calc Af Amer: >60
GFR calc Af Amer: >60
GFR calc Af Amer: >60
GFR calc Af Amer: >60
GFR calc Af Amer: >60
GFR calc Af Amer: >60
GFR calc Af Amer: >60 mL/min (ref >60)
GFR calc Af Amer: >60 mL/min (ref >60)
GFR calc Af Amer: >60 mL/min (ref >60)
GFR calc Af Amer: >60 mL/min (ref >60)
GFR calc Af Amer: >60 mL/min (ref >60)
GFR calc Af Amer: >60 mL/min (ref >60)
GFR calc non Af Amer: >60
GFR calc non Af Amer: >60
GFR calc non Af Amer: >60
GFR calc non Af Amer: >60
GFR calc non Af Amer: >60 mL/min (ref >60)
GFR calc non Af Amer: >60 mL/min (ref >60)
GFR calc non Af Amer: >60 mL/min (ref >60)
GFR calc non Af Amer: >60 mL/min (ref >60)
Glucose, Bld: 105 — ABNORMAL HIGH
Glucose, Bld: 111 — ABNORMAL HIGH
Glucose, Bld: 131 — ABNORMAL HIGH
Glucose, Bld: 136 — ABNORMAL HIGH
Glucose, Bld: 110 — ABNORMAL HIGH
Glucose, Bld: 159 — ABNORMAL HIGH
Glucose, Bld: 171 — ABNORMAL HIGH
Glucose, Bld: 107 mg/dL — ABNORMAL HIGH (ref 70–99)
Glucose, Bld: 128 mg/dL — ABNORMAL HIGH (ref 70–99)
Glucose, Bld: 154 mg/dL — ABNORMAL HIGH (ref 70–99)
Potassium: 3.1 — ABNORMAL LOW
Potassium: 3.1 — ABNORMAL LOW
Potassium: 3.5
Potassium: 3.9
Potassium: 3.9
Potassium: 3.9
Potassium: 4
Potassium: 4.1
Potassium: 3.1 mEq/L — ABNORMAL LOW (ref 3.5–5.1)
Potassium: 3.9 mEq/L (ref 3.5–5.1)
Potassium: 4 mEq/L (ref 3.5–5.1)
Potassium: 4.4 mEq/L (ref 3.5–5.1)
Sodium: 129 — ABNORMAL LOW
Sodium: 131 — ABNORMAL LOW
Sodium: 132 — ABNORMAL LOW
Sodium: 134 — ABNORMAL LOW
Sodium: 130 — ABNORMAL LOW
Sodium: 130 — ABNORMAL LOW
Sodium: 133 — ABNORMAL LOW
Sodium: 135
Sodium: 135
Sodium: 132 mEq/L — ABNORMAL LOW (ref 135–145)
Sodium: 137 mEq/L (ref 135–145)
Sodium: 137 mEq/L (ref 135–145)
Sodium: 138 mEq/L (ref 135–145)

## 2010-12-07 LAB — COMPREHENSIVE METABOLIC PANEL
ALT: 42
ALT: 59 — ABNORMAL HIGH
ALT: 67 — ABNORMAL HIGH
ALT: 135 U/L — ABNORMAL HIGH (ref 0–53)
ALT: 33 U/L (ref 0–53)
ALT: 74 U/L — ABNORMAL HIGH (ref 0–53)
AST: 31
AST: 31
AST: 40 — ABNORMAL HIGH
AST: 50 — ABNORMAL HIGH
AST: 54 — ABNORMAL HIGH
AST: 55 — ABNORMAL HIGH
AST: 25 U/L (ref 0–37)
AST: 53 U/L — ABNORMAL HIGH (ref 0–37)
Albumin: 1.9 — ABNORMAL LOW
Albumin: 2.1 — ABNORMAL LOW
Albumin: 2.2 — ABNORMAL LOW
Albumin: 2.3 — ABNORMAL LOW
Albumin: 2.7 — ABNORMAL LOW
Albumin: 2.9 — ABNORMAL LOW
Albumin: 3.1 g/dL — ABNORMAL LOW (ref 3.5–5.2)
Albumin: 3.3 g/dL — ABNORMAL LOW (ref 3.5–5.2)
Alkaline Phosphatase: 105
Alkaline Phosphatase: 125 — ABNORMAL HIGH
Alkaline Phosphatase: 91
Alkaline Phosphatase: 95
Alkaline Phosphatase: 75 U/L (ref 39–117)
Alkaline Phosphatase: 86 U/L (ref 39–117)
Alkaline Phosphatase: 99 U/L (ref 39–117)
BUN: 13
BUN: 14
BUN: 6
BUN: 9
BUN: 2 mg/dL — ABNORMAL LOW (ref 6–23)
CO2: 24
CO2: 25
CO2: 26
CO2: 27
CO2: 29
CO2: 29 mEq/L (ref 19–32)
Calcium: 7.9 — ABNORMAL LOW
Calcium: 8.1 — ABNORMAL LOW
Calcium: 8.2 — ABNORMAL LOW
Calcium: 8.2 — ABNORMAL LOW
Calcium: 8.4
Calcium: 9.2
Calcium: 9.5
Calcium: 9.4 mg/dL (ref 8.4–10.5)
Calcium: 9.5 mg/dL (ref 8.4–10.5)
Chloride: 102
Chloride: 103
Chloride: 103
Chloride: 103
Chloride: 95 — ABNORMAL LOW
Chloride: 99
Chloride: 102 mEq/L (ref 96–112)
Chloride: 105 mEq/L (ref 96–112)
Creatinine, Ser: 0.74
Creatinine, Ser: 0.77
Creatinine, Ser: 0.78
Creatinine, Ser: 0.87
Creatinine, Ser: 0.89
Creatinine, Ser: 0.68 mg/dL (ref 0.4–1.5)
GFR calc Af Amer: >60
GFR calc Af Amer: >60
GFR calc Af Amer: >60
GFR calc Af Amer: >60
GFR calc Af Amer: >60
GFR calc Af Amer: >60 mL/min (ref >60)
GFR calc Af Amer: >60 mL/min (ref >60)
GFR calc Af Amer: >60 mL/min (ref >60)
GFR calc Af Amer: >60 mL/min (ref >60)
GFR calc non Af Amer: >60
GFR calc non Af Amer: >60
GFR calc non Af Amer: >60
GFR calc non Af Amer: >60
GFR calc non Af Amer: >60 mL/min (ref >60)
Glucose, Bld: 109 — ABNORMAL HIGH
Glucose, Bld: 114 — ABNORMAL HIGH
Glucose, Bld: 134 — ABNORMAL HIGH
Glucose, Bld: 105 mg/dL — ABNORMAL HIGH (ref 70–99)
Glucose, Bld: 124 mg/dL — ABNORMAL HIGH (ref 70–99)
Potassium: 3.6
Potassium: 3.7
Potassium: 3.8
Potassium: 4.4
Potassium: 3.3 mEq/L — ABNORMAL LOW (ref 3.5–5.1)
Potassium: 4.1 mEq/L (ref 3.5–5.1)
Potassium: 4.8 mEq/L (ref 3.5–5.1)
Sodium: 132 — ABNORMAL LOW
Sodium: 134 — ABNORMAL LOW
Sodium: 135
Sodium: 136 mEq/L (ref 135–145)
Sodium: 139 mEq/L (ref 135–145)
Sodium: 140 mEq/L (ref 135–145)
Sodium: 141 mEq/L (ref 135–145)
Total Bilirubin: 0.4
Total Bilirubin: 0.5
Total Bilirubin: 0.5
Total Bilirubin: 0.6
Total Bilirubin: 0.6
Total Bilirubin: 1
Total Bilirubin: 0.2 mg/dL — ABNORMAL LOW (ref 0.3–1.2)
Total Protein: 6.8
Total Protein: 6.9
Total Protein: 7
Total Protein: 7.7
Total Protein: 6.2 g/dL (ref 6.0–8.3)
Total Protein: 6.4 g/dL (ref 6.0–8.3)
Total Protein: 6.7 g/dL (ref 6.0–8.3)

## 2010-12-07 LAB — CULTURE, BLOOD (ROUTINE X 2)
Culture: NO GROWTH
Culture: NO GROWTH

## 2010-12-07 LAB — CBC
HCT: 30 — ABNORMAL LOW
HCT: 30.5 — ABNORMAL LOW
HCT: 31.7 — ABNORMAL LOW
HCT: 26.7 — ABNORMAL LOW
HCT: 27.7 — ABNORMAL LOW
HCT: 27.8 — ABNORMAL LOW
HCT: 28.1 — ABNORMAL LOW
HCT: 28.8 — ABNORMAL LOW
HCT: 29.2 — ABNORMAL LOW
HCT: 29.3 — ABNORMAL LOW
HCT: 35.1 % — ABNORMAL LOW (ref 39.0–52.0)
HCT: 34.2 — ABNORMAL LOW
Hemoglobin: 10.2 — ABNORMAL LOW
Hemoglobin: 10.4 — ABNORMAL LOW
Hemoglobin: 10.7 — ABNORMAL LOW
Hemoglobin: 11.1 — ABNORMAL LOW
Hemoglobin: 11.9 — ABNORMAL LOW
Hemoglobin: 10.5 — ABNORMAL LOW
Hemoglobin: 8.9 — ABNORMAL LOW
Hemoglobin: 9.1 — ABNORMAL LOW
Hemoglobin: 9.2 — ABNORMAL LOW
Hemoglobin: 9.4 — ABNORMAL LOW
Hemoglobin: 9.8 — ABNORMAL LOW
Hemoglobin: 12.4 g/dL — ABNORMAL LOW (ref 13.0–17.0)
Hemoglobin: 12.9 g/dL — ABNORMAL LOW (ref 13.0–17.0)
Hemoglobin: 11.6 — ABNORMAL LOW
MCHC: 33.7
MCHC: 34.1
MCHC: 34.5
MCHC: 33
MCHC: 33.2
MCHC: 33.3
MCHC: 33.5
MCHC: 33.7
MCHC: 33.8
MCHC: 34.1
MCHC: 32.5 g/dL (ref 30.0–36.0)
MCHC: 33.2 g/dL (ref 30.0–36.0)
MCHC: 33.8
MCV: 88.7
MCV: 85.9
MCV: 86.6
MCV: 86.6
MCV: 88.4
MCV: 88.6
MCV: 89
Platelets: 293
Platelets: 381
Platelets: 392
Platelets: 325
Platelets: 439 — ABNORMAL HIGH
Platelets: 447 — ABNORMAL HIGH
Platelets: 462 — ABNORMAL HIGH
Platelets: 470 — ABNORMAL HIGH
Platelets: 583 — ABNORMAL HIGH
Platelets: 589 — ABNORMAL HIGH
Platelets: 233 10*3/uL (ref 150–400)
Platelets: 260 10*3/uL (ref 150–400)
Platelets: 250
RBC: 3.44 — ABNORMAL LOW
RBC: 3.59 — ABNORMAL LOW
RBC: 4 — ABNORMAL LOW
RBC: 3.04 — ABNORMAL LOW
RBC: 3.29 — ABNORMAL LOW
RBC: 3.31 — ABNORMAL LOW
RBC: 3.32 — ABNORMAL LOW
RBC: 3.62 — ABNORMAL LOW
RBC: 4.06 MIL/uL — ABNORMAL LOW (ref 4.22–5.81)
RBC: 4.35 MIL/uL (ref 4.22–5.81)
RBC: 4.53 MIL/uL (ref 4.22–5.81)
RDW: 13.6
RDW: 13.6
RDW: 13.9
RDW: 14.3
RDW: 13.6
RDW: 13.8
RDW: 14
RDW: 14.2
RDW: 14.2
RDW: 15
RDW: 13.5 % (ref 11.5–15.5)
RDW: 13.8 % (ref 11.5–15.5)
RDW: 13.9 % (ref 11.5–15.5)
RDW: 15.5
WBC: 12.7 — ABNORMAL HIGH
WBC: 15.2 — ABNORMAL HIGH
WBC: 10.3
WBC: 10.9 — ABNORMAL HIGH
WBC: 12.8 — ABNORMAL HIGH
WBC: 15.9 — ABNORMAL HIGH
WBC: 16 — ABNORMAL HIGH
WBC: 7.4
WBC: 4.1 10*3/uL (ref 4.0–10.5)
WBC: 4.6 10*3/uL (ref 4.0–10.5)
WBC: 5.3 10*3/uL (ref 4.0–10.5)
WBC: 9 10*3/uL (ref 4.0–10.5)

## 2010-12-07 LAB — TRIGLYCERIDES
Triglycerides: 100
Triglycerides: 164 — ABNORMAL HIGH
Triglycerides: 97

## 2010-12-07 LAB — PHOSPHORUS
Phosphorus: 3.1
Phosphorus: 3.1
Phosphorus: 3.5
Phosphorus: 4.6
Phosphorus: 5.2 — ABNORMAL HIGH
Phosphorus: 1.7 mg/dL — ABNORMAL LOW (ref 2.3–4.6)
Phosphorus: 2.9 mg/dL (ref 2.3–4.6)
Phosphorus: 3.9 mg/dL (ref 2.3–4.6)
Phosphorus: 4.4 mg/dL (ref 2.3–4.6)
Phosphorus: 4.6 mg/dL (ref 2.3–4.6)
Phosphorus: 4.9 mg/dL — ABNORMAL HIGH (ref 2.3–4.6)

## 2010-12-07 LAB — ANAEROBIC CULTURE

## 2010-12-07 LAB — PROTIME-INR
INR: 1
Prothrombin Time: 13.6

## 2010-12-07 LAB — CULTURE, ROUTINE-ABSCESS

## 2010-12-07 LAB — CHOLESTEROL, TOTAL
Cholesterol: 116
Cholesterol: 99
Cholesterol: 134 mg/dL (ref 0–200)

## 2010-12-07 LAB — MAGNESIUM
Magnesium: 2.1
Magnesium: 2.2
Magnesium: 2.5
Magnesium: 1.7 mg/dL (ref 1.5–2.5)
Magnesium: 1.9 mg/dL (ref 1.5–2.5)

## 2010-12-07 LAB — URINE CULTURE: Colony Count: NO GROWTH

## 2010-12-07 LAB — WOUND CULTURE

## 2010-12-07 LAB — VANCOMYCIN, TROUGH: Vancomycin Tr: 16.1

## 2010-12-07 LAB — GRAM STAIN

## 2010-12-07 LAB — PREALBUMIN: Prealbumin: 21.9

## 2010-12-09 LAB — BASIC METABOLIC PANEL
CO2: 27 mEq/L (ref 19–32)
Calcium: 9 mg/dL (ref 8.4–10.5)
Creatinine, Ser: 0.73 mg/dL (ref 0.4–1.5)
GFR calc Af Amer: >60 mL/min (ref >60)
Glucose, Bld: 252 mg/dL — ABNORMAL HIGH (ref 70–99)

## 2010-12-09 LAB — CBC
MCHC: 33.2 g/dL (ref 30.0–36.0)
RDW: 14.3 % (ref 11.5–15.5)

## 2010-12-09 LAB — APTT: aPTT: 31 seconds (ref 24–37)

## 2010-12-09 LAB — DIFFERENTIAL
Basophils Absolute: 0.1 10*3/uL (ref 0.0–0.1)
Basophils Relative: 1 % (ref 0–1)
Neutro Abs: 6.4 10*3/uL (ref 1.7–7.7)
Neutrophils Relative %: 79 % — ABNORMAL HIGH (ref 43–77)

## 2010-12-09 LAB — PROTIME-INR
INR: 1 (ref 0.00–1.49)
Prothrombin Time: 13.1 seconds (ref 11.6–15.2)

## 2011-06-10 ENCOUNTER — Encounter (HOSPITAL_COMMUNITY): Payer: Self-pay | Admitting: Emergency Medicine

## 2011-06-10 ENCOUNTER — Emergency Department (HOSPITAL_COMMUNITY)
Admission: EM | Admit: 2011-06-10 | Discharge: 2011-06-10 | Disposition: A | Discharge status: Home / Self Care | Payer: Self-pay | Attending: Emergency Medicine | Admitting: Emergency Medicine

## 2011-06-10 DIAGNOSIS — R112 Nausea with vomiting, unspecified: Secondary | ICD-10-CM | POA: Insufficient documentation

## 2011-06-10 DIAGNOSIS — K5289 Other specified noninfective gastroenteritis and colitis: Secondary | ICD-10-CM | POA: Insufficient documentation

## 2011-06-10 DIAGNOSIS — R197 Diarrhea, unspecified: Secondary | ICD-10-CM | POA: Insufficient documentation

## 2011-06-10 DIAGNOSIS — K529 Noninfective gastroenteritis and colitis, unspecified: Secondary | ICD-10-CM

## 2011-06-10 HISTORY — DX: Accidental discharge from unspecified firearms or gun, initial encounter: W34.00XA

## 2011-06-10 LAB — URINALYSIS, ROUTINE W REFLEX MICROSCOPIC
Ketones, ur: 40 mg/dL — AB
Nitrite: NEGATIVE
Urobilinogen, UA: 0.2 mg/dL (ref 0.0–1.0)

## 2011-06-10 LAB — POCT I-STAT, CHEM 8
Calcium, Ion: 1.12 mmol/L (ref 1.12–1.32)
Chloride: 109 mEq/L (ref 96–112)
Creatinine, Ser: 1 mg/dL (ref 0.50–1.35)
Glucose, Bld: 111 mg/dL — ABNORMAL HIGH (ref 70–99)
HCT: 47 % (ref 39.0–52.0)
Potassium: 3.3 mEq/L — ABNORMAL LOW (ref 3.5–5.1)

## 2011-06-10 LAB — URINE MICROSCOPIC-ADD ON

## 2011-06-10 MED ORDER — POTASSIUM CHLORIDE CRYS ER 20 MEQ PO TBCR
40.0000 meq | EXTENDED_RELEASE_TABLET | Freq: Once | ORAL | Status: AC
  Administered 2011-06-10: 40 meq via ORAL
  Filled 2011-06-10: qty 2

## 2011-06-10 MED ORDER — ONDANSETRON HCL 4 MG/2ML IJ SOLN
4.0000 mg | Freq: Once | INTRAMUSCULAR | Status: AC
  Administered 2011-06-10: 4 mg via INTRAVENOUS
  Filled 2011-06-10: qty 2

## 2011-06-10 MED ORDER — ONDANSETRON HCL 4 MG PO TABS: 4.0000 mg | ORAL_TABLET | Freq: Four times a day (QID) | ORAL | Status: AC

## 2011-06-10 MED ORDER — SODIUM CHLORIDE 0.9 % IV BOLUS (SEPSIS)
1000.0000 mL | Freq: Once | INTRAVENOUS | Status: AC
  Administered 2011-06-10: 1000 mL via INTRAVENOUS

## 2011-06-10 NOTE — ED Notes (Signed)
PT reports feeling better and passed PO challenge.

## 2011-06-10 NOTE — ED Notes (Signed)
Patient is resting comfortably. 
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
## 2011-06-10 NOTE — Discharge Instructions (Signed)
Return to the ED with any concern including vomiting and not able to keep down liquids, abdominal pain- especially if it localizes to the right lower abdomen, blood in vomit or stools, decreased level of alertness/lethargy, or any other alarming symptoms

## 2011-06-10 NOTE — ED Notes (Signed)
PT ambulated with a steady gait; VSS; A&Ox3; no signs of distress; respirations even and unlabored; skin warm and dry. No questions at this time; pt reports he is feeling better.

## 2012-06-13 ENCOUNTER — Telehealth (HOSPITAL_COMMUNITY): Payer: Self-pay | Admitting: Emergency Medicine

## 2012-06-15 ENCOUNTER — Ambulatory Visit (INDEPENDENT_AMBULATORY_CARE_PROVIDER_SITE_OTHER): Payer: 59 | Admitting: Orthopedic Surgery

## 2012-06-15 ENCOUNTER — Encounter (INDEPENDENT_AMBULATORY_CARE_PROVIDER_SITE_OTHER): Payer: Self-pay

## 2012-06-15 VITALS — BP 120/82 | HR 72 | Temp 98.5°F | Resp 12 | Ht 72.0 in | Wt 184.4 lb

## 2012-06-15 DIAGNOSIS — L989 Disorder of the skin and subcutaneous tissue, unspecified: Secondary | ICD-10-CM

## 2012-06-18 DIAGNOSIS — L989 Disorder of the skin and subcutaneous tissue, unspecified: Secondary | ICD-10-CM | POA: Insufficient documentation
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
## 2012-06-21 ENCOUNTER — Telehealth (HOSPITAL_COMMUNITY): Payer: Self-pay | Admitting: Emergency Medicine

## 2012-06-21 NOTE — Telephone Encounter (Signed)
[name] he could come 4/25 anytime between 10 and 11.

## 2012-06-29 ENCOUNTER — Other Ambulatory Visit: Payer: Self-pay

## 2012-06-29 ENCOUNTER — Other Ambulatory Visit (INDEPENDENT_AMBULATORY_CARE_PROVIDER_SITE_OTHER): Payer: Self-pay | Admitting: General Surgery

## 2012-06-29 ENCOUNTER — Encounter (INDEPENDENT_AMBULATORY_CARE_PROVIDER_SITE_OTHER): Payer: Self-pay

## 2012-06-29 ENCOUNTER — Ambulatory Visit (INDEPENDENT_AMBULATORY_CARE_PROVIDER_SITE_OTHER): Payer: 59 | Admitting: Orthopedic Surgery

## 2012-06-29 VITALS — BP 134/78 | HR 74 | Temp 98.4°F | Resp 18 | Ht 72.0 in | Wt 180.0 lb

## 2012-06-29 DIAGNOSIS — L723 Sebaceous cyst: Secondary | ICD-10-CM

## 2012-06-29 DIAGNOSIS — T8189XS Other complications of procedures, not elsewhere classified, sequela: Secondary | ICD-10-CM

## 2012-06-29 DIAGNOSIS — L989 Disorder of the skin and subcutaneous tissue, unspecified: Secondary | ICD-10-CM

## 2012-06-29 MED ORDER — HYDROCODONE-ACETAMINOPHEN 5-325 MG PO TABS: 1.0000 | ORAL_TABLET | ORAL | Status: AC | PRN

## 2012-06-29 NOTE — Patient Instructions (Signed)
Wash wound daily in shower with soap and water. °Do not soak. °Apply antibiotic ointment (e.g. Neosporin) twice daily and as needed to keep moist. °Cover with dry dressing. ° °

## 2012-06-29 NOTE — Addendum Note (Signed)
Addended by: Charma Igo on: 06/29/2012 03:17 PM   Modules accepted: Orders

## 2012-06-29 NOTE — Progress Notes (Signed)
Procedure Note  Procedure: Excisional biopsy abdomen  Indication: Abdominal wall skin lesion  Surgeon: Charma Igo, PA-C  Assist: None  Anesthesia: <72ml 1% lidocaine w/epi  EBL: None  Complications: None  Findings: Risks and benefits of procedure, as well as not doing procedure, were explained to patient at last visit. He still wished to proceed with removal. Written consent was obtained. Approximately 1 - 1.58ml 1% lidocaine w/epinephrine was infiltrated into the subcutaneous tissues around and under the lesion. The area was prepped and draped sterilely and adequate anesthesia was confirmed. An elliptical incision was made around the lesion with a #15 blade. The skin edge was grasped with Adson's forceps and the tissue and lesions were excised at skin depth. The offending suture was identified in the wound bed. I was able to grasp this with the forceps and it was pulled out to it's maximum length, ~1cm, and trimmed with iris scissors. The remaining wound was dressed with antibiotic ointment and a non-adherent dressing. The lesions will be sent to pathology. The patient tolerated the procedure well.   Evan Caldron, PA-C Pager: [phone] General Trauma PA Pager: [phone]

## 2012-07-06 ENCOUNTER — Encounter (INDEPENDENT_AMBULATORY_CARE_PROVIDER_SITE_OTHER): Payer: 59

## 2015-01-19 ENCOUNTER — Other Ambulatory Visit: Payer: Self-pay | Admitting: Internal Medicine

## 2015-01-19 ENCOUNTER — Ambulatory Visit
Admission: RE | Admit: 2015-01-19 | Discharge: 2015-01-19 | Disposition: A | Discharge status: Home / Self Care | Payer: 59 | Source: Ambulatory Visit | Attending: Internal Medicine | Admitting: Internal Medicine

## 2015-01-19 DIAGNOSIS — R52 Pain, unspecified: Secondary | ICD-10-CM

## 2016-06-24 ENCOUNTER — Emergency Department (HOSPITAL_COMMUNITY): Payer: 59

## 2016-06-24 ENCOUNTER — Emergency Department (HOSPITAL_COMMUNITY)
Admission: EM | Admit: 2016-06-24 | Discharge: 2016-06-24 | Disposition: A | Discharge status: Home / Self Care | Payer: 59 | Attending: Emergency Medicine | Admitting: Emergency Medicine

## 2016-06-24 ENCOUNTER — Encounter (HOSPITAL_COMMUNITY): Payer: Self-pay

## 2016-06-24 DIAGNOSIS — F12920 Cannabis use, unspecified with intoxication, uncomplicated: Secondary | ICD-10-CM

## 2016-06-24 DIAGNOSIS — Z87891 Personal history of nicotine dependence: Secondary | ICD-10-CM | POA: Insufficient documentation

## 2016-06-24 DIAGNOSIS — Z79899 Other long term (current) drug therapy: Secondary | ICD-10-CM | POA: Diagnosis not present

## 2016-06-24 DIAGNOSIS — R109 Unspecified abdominal pain: Secondary | ICD-10-CM | POA: Diagnosis present

## 2016-06-24 DIAGNOSIS — K529 Noninfective gastroenteritis and colitis, unspecified: Secondary | ICD-10-CM | POA: Insufficient documentation

## 2016-06-24 DIAGNOSIS — F12120 Cannabis abuse with intoxication, uncomplicated: Secondary | ICD-10-CM | POA: Diagnosis not present

## 2016-06-24 LAB — I-STAT CHEM 8, ED
BUN: 12 mg/dL (ref 6–20)
CREATININE: 1.1 mg/dL (ref 0.61–1.24)
Calcium, Ion: 1.07 mmol/L — ABNORMAL LOW (ref 1.15–1.40)
Chloride: 103 mmol/L (ref 101–111)
GLUCOSE: 141 mg/dL — AB (ref 65–99)
HCT: 50 % (ref 39.0–52.0)
HEMOGLOBIN: 17 g/dL (ref 13.0–17.0)
Potassium: 3.1 mmol/L — ABNORMAL LOW (ref 3.5–5.1)
Sodium: 138 mmol/L (ref 135–145)
TCO2: 20 mmol/L (ref 0–100)

## 2016-06-24 LAB — URINALYSIS, ROUTINE W REFLEX MICROSCOPIC
Bacteria, UA: NONE SEEN
Bilirubin Urine: NEGATIVE
Glucose, UA: NEGATIVE mg/dL
Ketones, ur: 5 mg/dL — AB
LEUKOCYTES UA: NEGATIVE
Nitrite: NEGATIVE
PH: 5 (ref 5.0–8.0)
Protein, ur: NEGATIVE mg/dL
SPECIFIC GRAVITY, URINE: 1.019 (ref 1.005–1.030)
SQUAMOUS EPITHELIAL / LPF: NONE SEEN

## 2016-06-24 LAB — CBC
HCT: 46.4 % (ref 39.0–52.0)
Hemoglobin: 16.2 g/dL (ref 13.0–17.0)
MCH: 29 pg (ref 26.0–34.0)
MCHC: 34.9 g/dL (ref 30.0–36.0)
MCV: 83 fL (ref 78.0–100.0)
PLATELETS: 221 10*3/uL (ref 150–400)
RBC: 5.59 MIL/uL (ref 4.22–5.81)
RDW: 12.9 % (ref 11.5–15.5)
WBC: 10.1 10*3/uL (ref 4.0–10.5)

## 2016-06-24 LAB — COMPREHENSIVE METABOLIC PANEL
ALK PHOS: 89 U/L (ref 38–126)
ALT: 52 U/L (ref 17–63)
AST: 43 U/L — ABNORMAL HIGH (ref 15–41)
Albumin: 4.8 g/dL (ref 3.5–5.0)
Anion gap: 15 (ref 5–15)
BILIRUBIN TOTAL: 0.7 mg/dL (ref 0.3–1.2)
BUN: 13 mg/dL (ref 6–20)
CALCIUM: 9.4 mg/dL (ref 8.9–10.3)
CO2: 20 mmol/L — ABNORMAL LOW (ref 22–32)
CREATININE: 1.24 mg/dL (ref 0.61–1.24)
Chloride: 102 mmol/L (ref 101–111)
Glucose, Bld: 149 mg/dL — ABNORMAL HIGH (ref 65–99)
Potassium: 3.1 mmol/L — ABNORMAL LOW (ref 3.5–5.1)
SODIUM: 137 mmol/L (ref 135–145)
TOTAL PROTEIN: 8.4 g/dL — AB (ref 6.5–8.1)

## 2016-06-24 LAB — RAPID URINE DRUG SCREEN, HOSP PERFORMED
Amphetamines: NOT DETECTED
BARBITURATES: NOT DETECTED
BENZODIAZEPINES: NOT DETECTED
COCAINE: NOT DETECTED
Opiates: NOT DETECTED
TETRAHYDROCANNABINOL: POSITIVE — AB

## 2016-06-24 LAB — CBG MONITORING, ED: GLUCOSE-CAPILLARY: 125 mg/dL — AB (ref 65–99)

## 2016-06-24 MED ORDER — SODIUM CHLORIDE 0.9 % IV BOLUS (SEPSIS)
1000.0000 mL | Freq: Once | INTRAVENOUS | Status: AC
Start: 2016-06-24 — End: 2016-06-24
  Administered 2016-06-24: 1000 mL via INTRAVENOUS

## 2016-06-24 MED ORDER — ONDANSETRON HCL 4 MG/2ML IJ SOLN
4.0000 mg | Freq: Once | INTRAMUSCULAR | Status: AC
  Administered 2016-06-24: 4 mg via INTRAVENOUS
  Filled 2016-06-24: qty 2

## 2016-06-24 MED ORDER — ONDANSETRON 8 MG PO TBDP: ORAL_TABLET | ORAL | 0 refills | Status: AC

## 2016-06-24 MED ORDER — KETOROLAC TROMETHAMINE 30 MG/ML IJ SOLN
30.0000 mg | Freq: Once | INTRAMUSCULAR | Status: AC
  Administered 2016-06-24: 30 mg via INTRAVENOUS
  Filled 2016-06-24: qty 1

## 2016-06-24 MED ORDER — IOPAMIDOL (ISOVUE-300) INJECTION 61%
INTRAVENOUS | Status: AC
  Filled 2016-06-24: qty 100

## 2016-06-24 MED ORDER — IOPAMIDOL (ISOVUE-300) INJECTION 61%
100.0000 mL | Freq: Once | INTRAVENOUS | Status: AC | PRN
  Administered 2016-06-24: 100 mL via INTRAVENOUS
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
## 2016-06-24 NOTE — ED Notes (Signed)
Patient given ginger ale per provider.  Patient tolerating po fluids well.

## 2016-06-24 NOTE — ED Triage Notes (Addendum)
Pt c/o abdominal pain, altered mental status, diarrhea since 1am this morning. Pt is now stating that he cannot feel or open his hands. Pt Diaphretic and tachypnic in triage.  Pt had vomited bile and has been dry heaving. Per pt visitor pt has not been acting like himself.

## 2022-06-07 DIAGNOSIS — L02214 Cutaneous abscess of groin: Secondary | ICD-10-CM | POA: Diagnosis not present

## 2022-09-19 DIAGNOSIS — R945 Abnormal results of liver function studies: Secondary | ICD-10-CM | POA: Diagnosis not present
